# Patient Record
Sex: Female | Born: 1975
Health system: Southern US, Community
[De-identification: ages and names within clinical notes are randomized; demographics above are authoritative.]

## PROBLEM LIST (undated history)

## (undated) ENCOUNTER — Emergency Department (HOSPITAL_BASED_OUTPATIENT_CLINIC_OR_DEPARTMENT_OTHER): Admission: EM | Payer: Medicaid Other

## (undated) DIAGNOSIS — E079 Disorder of thyroid, unspecified: Secondary | ICD-10-CM

---

## 2009-12-08 ENCOUNTER — Ambulatory Visit (HOSPITAL_BASED_OUTPATIENT_CLINIC_OR_DEPARTMENT_OTHER): Admission: RE | Admit: 2009-12-08 | Discharge: 2009-12-08 | Payer: Self-pay | Admitting: Obstetrics and Gynecology

## 2009-12-08 ENCOUNTER — Ambulatory Visit: Payer: Self-pay | Admitting: Diagnostic Radiology

## 2019-06-24 ENCOUNTER — Encounter (HOSPITAL_BASED_OUTPATIENT_CLINIC_OR_DEPARTMENT_OTHER): Payer: Self-pay | Admitting: Emergency Medicine

## 2019-06-24 ENCOUNTER — Emergency Department (HOSPITAL_BASED_OUTPATIENT_CLINIC_OR_DEPARTMENT_OTHER)
Admission: EM | Admit: 2019-06-24 | Discharge: 2019-06-24 | Disposition: A | Discharge status: Home / Self Care | Payer: Medicaid Other | Attending: Emergency Medicine | Admitting: Emergency Medicine

## 2019-06-24 ENCOUNTER — Other Ambulatory Visit: Payer: Self-pay

## 2019-06-24 DIAGNOSIS — M545 Low back pain, unspecified: Secondary | ICD-10-CM

## 2019-06-24 MED ORDER — METHYLPREDNISOLONE 4 MG PO TBPK: ORAL_TABLET | ORAL | 0 refills | Status: AC

## 2019-06-24 MED ORDER — CYCLOBENZAPRINE HCL 10 MG PO TABS: 10.0000 mg | ORAL_TABLET | Freq: Two times a day (BID) | ORAL | 0 refills | Status: AC | PRN

## 2019-06-24 MED FILL — CYCLOBENZAPRINE HCL 10 MG T: 10 | 10 days supply | Qty: 20 | Fill #0

## 2019-06-24 MED FILL — METHYLPREDNISOLONE 4 MG TBP: 4 | 6 days supply | Qty: 21 | Fill #0

## 2019-06-24 NOTE — ED Provider Notes (Signed)
[facility] EMERGENCY DEPARTMENT Provider Note   CSN: [id number] Arrival date & time: 06/24/19  6834     History   Chief Complaint Chief Complaint  Patient presents with  . Back Pain    HPI Julie Hubbard is a 43 y.o. female.     The history is provided by the patient.  Back Pain Location:  Lumbar spine Quality:  Aching Radiates to:  Does not radiate Pain severity:  Mild Onset quality:  Gradual Duration:  2 days Timing:  Intermittent Progression:  Waxing and waning Chronicity:  Recurrent Context: physical stress   Relieved by:  NSAIDs Worsened by:  Movement Associated symptoms: no abdominal pain, no abdominal swelling, no bladder incontinence, no bowel incontinence, no chest pain, no dysuria, no fever, no headaches, no leg pain, no numbness, no paresthesias, no pelvic pain, no perianal numbness, no tingling, no weakness and no weight loss     History reviewed. No pertinent past medical history.  There are no active problems to display for this patient.   History reviewed. No pertinent surgical history.   OB History   No obstetric history on file.      Home Medications    Prior to Admission medications   Medication Sig Start Date End Date Taking? Authorizing Provider  omeprazole (PRILOSEC) 40 MG capsule Take 40 mg by mouth daily.   Yes [provider]    Family History No family history on file.  Social History Social History   Tobacco Use  . Smoking status: Never Smoker  . Smokeless tobacco: Never Used  Substance Use Topics  . Alcohol use: Not Currently  . Drug use: Never     Allergies   Patient has no known allergies.   Review of Systems Review of Systems  Constitutional: Negative for chills, fever and weight loss.  HENT: Negative for ear pain and sore throat.   Eyes: Negative for pain and visual disturbance.  Respiratory: Negative for cough and shortness of breath.   Cardiovascular: Negative for chest pain and  palpitations.  Gastrointestinal: Negative for abdominal pain, bowel incontinence and vomiting.  Genitourinary: Negative for bladder incontinence, dysuria, hematuria and pelvic pain.  Musculoskeletal: Positive for back pain. Negative for arthralgias.  Skin: Negative for color change and rash.  Neurological: Negative for tingling, seizures, syncope, weakness, numbness, headaches and paresthesias.  All other systems reviewed and are negative.    Physical Exam Updated Vital Signs BP 124/85 (BP Location: Right Arm)   Pulse 75   Temp 98.4 F (36.9 C) (Oral)   Resp 16   Ht 5\' 7"  (1.702 m)   Wt 83 kg   LMP 06/01/2019   SpO2 100%   BMI 28.66 kg/m   Physical Exam Vitals signs and nursing note reviewed.  Constitutional:      General: She is not in acute distress.    Appearance: She is well-developed.  HENT:     Head: Normocephalic and atraumatic.  Eyes:     Conjunctiva/sclera: Conjunctivae normal.     Pupils: Pupils are equal, round, and reactive to light.  Neck:     Musculoskeletal: Normal range of motion and neck supple. No muscular tenderness.  Cardiovascular:     Rate and Rhythm: Normal rate and regular rhythm.     Pulses: Normal pulses.     Heart sounds: Normal heart sounds. No murmur.  Pulmonary:     Effort: Pulmonary effort is normal. No respiratory distress.     Breath sounds: Normal breath sounds.  Abdominal:     Palpations: Abdomen is soft.     Tenderness: There is no abdominal tenderness.  Musculoskeletal: Normal range of motion.        General: Tenderness present.     Comments: No midline spinal tenderness, tenderness to right-sided paraspinal lumbar muscles  Skin:    General: Skin is warm and dry.  Neurological:     General: No focal deficit present.     Mental Status: She is alert and oriented to person, place, and time.     Cranial Nerves: No cranial nerve deficit.     Sensory: No sensory deficit.     Motor: No weakness.     Coordination: Coordination  normal.     Gait: Gait normal.     Comments: 5+ out of 5 strength throughout, normal sensation, no drift      ED Treatments / Results  Labs (all labs ordered are listed, but only abnormal results are displayed) Labs Reviewed - No data to display  EKG None  Radiology No results found.  Procedures Procedures (including critical care time)  Medications Ordered in ED Medications - No data to display   Initial Impression / Assessment and Plan / ED Course  I have reviewed the triage vital signs and the nursing notes.  Pertinent labs & imaging results that were available during my care of the patient were reviewed by me and considered in my medical decision making (see chart for details).        Julie Hubbard is a 43 year old female with history of back pain who presents to the ED with back spasm, back pain.  Patient with normal vitals.  No fever.  Patient states that 2 days ago she threw her back out again.  Mostly in the right lower spine.  Does not have any numbness or tingling.  No concern for cauda equina.  No fever.  No concern for epidural abscess.  History of the same.  Has used steroids in the past with some relief.  Has taken Tylenol Motrin with some relief.  Has a normal neurological exam.  No midline spinal tenderness.  Denies any trauma.  No concern for any cord compression.  Will prescribe prednisone and Flexeril.  Recommend close follow-up with primary care doctor as patient would likely benefit from physical therapy, possibly outpatient MRI, possibly pain referral.  Given return precautions and discharged in ED in good condition.  Patient denied any urinary symptoms.  No concern for pregnancy.  Last menstrual cycle about 2 to 3 weeks ago.  This chart was dictated using voice recognition software.  Despite best efforts to proofread,  errors can occur which can change the documentation meaning.    Final Clinical Impressions(s) / ED Diagnoses   Final diagnoses:  Acute  right-sided low back pain, unspecified whether sciatica present    ED Discharge Orders    None       Lennice Sites, DO 06/24/19 [phone]

## 2019-06-24 NOTE — ED Notes (Signed)
Pt verbalized understanding of dc instructions.

## 2019-06-24 NOTE — ED Triage Notes (Signed)
Low back pain right in the middle, non radiating x 2 days. Denies injury. Worse with movement.

## 2020-04-28 ENCOUNTER — Encounter: Payer: Medicaid Other | Admitting: Family Medicine

## 2020-06-02 ENCOUNTER — Ambulatory Visit (INDEPENDENT_AMBULATORY_CARE_PROVIDER_SITE_OTHER): Payer: Medicaid Other | Admitting: Obstetrics & Gynecology

## 2020-06-02 ENCOUNTER — Encounter: Payer: Self-pay | Admitting: Obstetrics & Gynecology

## 2020-06-02 ENCOUNTER — Other Ambulatory Visit: Payer: Self-pay

## 2020-06-02 VITALS — BP 115/75 | HR 70 | Ht 68.0 in | Wt 195.0 lb

## 2020-06-02 DIAGNOSIS — K5900 Constipation, unspecified: Secondary | ICD-10-CM

## 2020-06-02 DIAGNOSIS — R1011 Right upper quadrant pain: Secondary | ICD-10-CM | POA: Diagnosis not present

## 2020-06-02 DIAGNOSIS — N941 Unspecified dyspareunia: Secondary | ICD-10-CM

## 2020-06-02 MED ORDER — DICYCLOMINE HCL 10 MG PO CAPS
10.0000 mg | ORAL_CAPSULE | Freq: Every day | ORAL | 0 refills | Status: AC | PRN
Start: 2020-06-02 — End: ?

## 2020-06-02 NOTE — Patient Instructions (Signed)

## 2020-06-02 NOTE — Progress Notes (Signed)
History:  44 y.o. [id number] here today for dyspareunia. Pt reports that with intercourse she has pain in her abd. She denies pain with insertion or with deep thrusting. She reports pain in the upper quads bilaterally that is increased with intercourse. She is sexually active ~3-4 times per month. She says that as time continues she has started to note pain when she begins to think about beign sexually active. Pt reports that she has been married for 15 years and this pain has been present for the past 7-8 years. She has spoken to multiple docs with no relief of her sx. She reports a h/o constipation. She was prev placed on meds that worked but, it is stopped working at present. She does not exercise much. She does not eat many raw fruits or vegetable.         The following portions of the patient's history were reviewed and updated as appropriate: allergies, current medications, past family history, past medical history, past social history, past surgical history and problem list.  Review of Systems:  Pertinent items are noted in HPI.    Objective:  Physical Exam Blood pressure 115/75, pulse 70, height 5\' 8"  (1.727 m), weight 195 lb (88.5 kg), last menstrual period 05/17/2020.  CONSTITUTIONAL: Well-developed, well-nourished female in no acute distress.  HENT:  Normocephalic, atraumatic EYES: Conjunctivae and EOM are normal. No scleral icterus.  NECK: Normal range of motion SKIN: Skin is warm and dry. No rash noted. Not diaphoretic.No pallor. NEUROLGIC: Alert and oriented to person, place, and time. Normal coordination.  Abd: Soft, nontender and nondistended Pelvic: Normal appearing external genitalia; normal appearing vaginal mucosa and cervix.  Normal discharge.  Small uterus, no other palpable masses, no uterine or adnexal tenderness. The ovaries are palpable but, very low in the pelvis and outside of where the pt reports pain.     Assessment & Plan:  RUQ pain- Pt has never had her gallbladder  eval. The abd pain that pt describes with intercourse appear more GI in nature.   Dyspareunia exact etiology not understood. Likely GI. Will try Bentyl prn with intercourse to see if this provides some relief.   Constipation- reviewed increased fiber in her diet. Also recommended Miralax nightly.   Diagnoses and all orders for this visit:  Right upper quadrant pain -     Cancel: 07/17/2020 Abdomen Limited; Future -     US Abdomen Limited RUQ (LIVER/GB); Future -     dicyclomine (BENTYL) 10 MG capsule; Take 1 capsule (10 mg total) by mouth daily as needed for spasms. Prior to intercourse.  Dyspareunia, female -     dicyclomine (BENTYL) 10 MG capsule; Take 1 capsule (10 mg total) by mouth daily as needed for spasms. Prior to intercourse.  Constipation, unspecified constipation type    Total face-to-face time with patient was 30 min.  Greater than 50% was spent in counseling and coordination of care with the patient.   Khadija Thier L. Harraway-Smith, M.D., [place]

## 2020-06-10 ENCOUNTER — Ambulatory Visit (HOSPITAL_BASED_OUTPATIENT_CLINIC_OR_DEPARTMENT_OTHER)
Admission: RE | Admit: 2020-06-10 | Discharge: 2020-06-10 | Disposition: A | Discharge status: Home / Self Care | Payer: Medicaid Other | Source: Ambulatory Visit | Attending: Obstetrics & Gynecology | Admitting: Obstetrics & Gynecology

## 2020-06-10 ENCOUNTER — Other Ambulatory Visit: Payer: Self-pay

## 2020-06-10 DIAGNOSIS — R1011 Right upper quadrant pain: Secondary | ICD-10-CM

## 2020-12-20 ENCOUNTER — Emergency Department (HOSPITAL_BASED_OUTPATIENT_CLINIC_OR_DEPARTMENT_OTHER): Payer: Medicaid Other

## 2020-12-20 ENCOUNTER — Emergency Department (HOSPITAL_BASED_OUTPATIENT_CLINIC_OR_DEPARTMENT_OTHER)
Admission: EM | Admit: 2020-12-20 | Discharge: 2020-12-20 | Disposition: A | Discharge status: Home / Self Care | Payer: Medicaid Other | Attending: Emergency Medicine | Admitting: Emergency Medicine

## 2020-12-20 ENCOUNTER — Encounter (HOSPITAL_BASED_OUTPATIENT_CLINIC_OR_DEPARTMENT_OTHER): Payer: Self-pay

## 2020-12-20 ENCOUNTER — Other Ambulatory Visit: Payer: Self-pay

## 2020-12-20 DIAGNOSIS — U071 COVID-19: Secondary | ICD-10-CM | POA: Diagnosis not present

## 2020-12-20 DIAGNOSIS — M791 Myalgia, unspecified site: Secondary | ICD-10-CM | POA: Diagnosis present

## 2020-12-20 DIAGNOSIS — R059 Cough, unspecified: Secondary | ICD-10-CM

## 2020-12-20 DIAGNOSIS — R109 Unspecified abdominal pain: Secondary | ICD-10-CM | POA: Insufficient documentation

## 2020-12-20 DIAGNOSIS — R52 Pain, unspecified: Secondary | ICD-10-CM

## 2020-12-20 LAB — CBC WITH DIFFERENTIAL/PLATELET
Abs Immature Granulocytes: 0.01 10*3/uL (ref 0.00–0.07)
Basophils Absolute: 0 10*3/uL (ref 0.0–0.1)
Basophils Relative: 1 %
Eosinophils Absolute: 0.1 10*3/uL (ref 0.0–0.5)
Eosinophils Relative: 2 %
HCT: 40.8 % (ref 36.0–46.0)
Hemoglobin: 13.4 g/dL (ref 12.0–15.0)
Immature Granulocytes: 0 %
Lymphocytes Relative: 16 %
Lymphs Abs: 0.8 10*3/uL (ref 0.7–4.0)
MCH: 27 pg (ref 26.0–34.0)
MCHC: 32.8 g/dL (ref 30.0–36.0)
MCV: 82.3 fL (ref 80.0–100.0)
Monocytes Absolute: 0.8 10*3/uL (ref 0.1–1.0)
Monocytes Relative: 16 %
Neutro Abs: 3.3 10*3/uL (ref 1.7–7.7)
Neutrophils Relative %: 65 %
Platelets: 190 10*3/uL (ref 150–400)
RBC: 4.96 MIL/uL (ref 3.87–5.11)
RDW: 13.7 % (ref 11.5–15.5)
WBC: 5 10*3/uL (ref 4.0–10.5)
nRBC: 0 % (ref 0.0–0.2)

## 2020-12-20 LAB — COMPREHENSIVE METABOLIC PANEL
ALT: 22 U/L (ref 0–44)
AST: 22 U/L (ref 15–41)
Albumin: 3.6 g/dL (ref 3.5–5.0)
Alkaline Phosphatase: 75 U/L (ref 38–126)
Anion gap: 7 (ref 5–15)
BUN: 11 mg/dL (ref 6–20)
CO2: 24 mmol/L (ref 22–32)
Calcium: 8.4 mg/dL — ABNORMAL LOW (ref 8.9–10.3)
Chloride: 104 mmol/L (ref 98–111)
Creatinine, Ser: 0.78 mg/dL (ref 0.44–1.00)
GFR, Estimated: >60 mL/min (ref >60)
Glucose, Bld: 99 mg/dL (ref 70–99)
Potassium: 3.5 mmol/L (ref 3.5–5.1)
Sodium: 135 mmol/L (ref 135–145)
Total Bilirubin: 0.2 mg/dL — ABNORMAL LOW (ref 0.3–1.2)
Total Protein: 7.4 g/dL (ref 6.5–8.1)

## 2020-12-20 LAB — URINALYSIS, ROUTINE W REFLEX MICROSCOPIC
Bilirubin Urine: NEGATIVE
Glucose, UA: NEGATIVE mg/dL
Ketones, ur: NEGATIVE mg/dL
Nitrite: NEGATIVE
Protein, ur: NEGATIVE mg/dL
Specific Gravity, Urine: 1.01 (ref 1.005–1.030)
pH: 6 (ref 5.0–8.0)

## 2020-12-20 LAB — RESP PANEL BY RT-PCR (FLU A&B, COVID) ARPGX2
Influenza A by PCR: NEGATIVE
Influenza B by PCR: NEGATIVE
SARS Coronavirus 2 by RT PCR: POSITIVE — AB

## 2020-12-20 LAB — TROPONIN I (HIGH SENSITIVITY): Troponin I (High Sensitivity): <2 ng/L (ref <18)

## 2020-12-20 LAB — URINALYSIS, MICROSCOPIC (REFLEX)

## 2020-12-20 LAB — PREGNANCY, URINE: Preg Test, Ur: NEGATIVE

## 2020-12-20 LAB — LIPASE, BLOOD: Lipase: 35 U/L (ref 11–51)

## 2020-12-20 LAB — MAGNESIUM: Magnesium: 1.8 mg/dL (ref 1.7–2.4)

## 2020-12-20 LAB — LACTIC ACID, PLASMA: Lactic Acid, Venous: 0.9 mmol/L (ref 0.5–1.9)

## 2020-12-20 MED ORDER — KETOROLAC TROMETHAMINE 15 MG/ML IJ SOLN
15.0000 mg | Freq: Once | INTRAMUSCULAR | Status: AC
  Administered 2020-12-20: 15 mg via INTRAVENOUS
  Filled 2020-12-20: qty 1

## 2020-12-20 MED ORDER — SODIUM CHLORIDE 0.9 % IV BOLUS
1000.0000 mL | Freq: Once | INTRAVENOUS | Status: AC
  Administered 2020-12-20: 1000 mL via INTRAVENOUS

## 2020-12-20 NOTE — ED Notes (Signed)
Patient transported to X-ray 

## 2020-12-20 NOTE — ED Notes (Signed)
PT placed on blood pressure cuff and full set of vitals obtained

## 2020-12-20 NOTE — ED Provider Notes (Signed)
[facility] EMERGENCY DEPARTMENT Provider Note   CSN: [id number] Arrival date & time: 12/20/20  5462     History Chief Complaint  Patient presents with  . Back Pain    Julie Hubbard is a 45 y.o. female.  HPI  45 year old female with no pertinent past medical history presents to the emergency department today for evaluation of body aches, back pain, abdominal pain, nausea.  Interpreter was used at bedside.  Patient reports that she was showering last night and felt chills with subjective fevers.  Patient reports lower abdominal pain and diffuse back pain.  Patient reports not feeling well.  This morning she took Tylenol with this does not help with her pain.  Patient reports that her both shoulders feel stiff.  No chest pain or shortness of breath.  No diarrhea.  No urinary symptoms.  She has been coughing with some phlegm and she is concerned she may have pneumonia.  She reports being very fatigued, tired over the past several days.  No recent surgeries or hospitalizations.  Patient denies a history of oral contraceptive use.  No swelling in lower extremities.  No recent long car rides or plane trips.  Patient denies history of PE/DVT.  No cardiac history.  No known sick contacts.      History reviewed. No pertinent past medical history.  There are no problems to display for this patient.   History reviewed. No pertinent surgical history.   OB History    Gravida  3   Para  3   Term  3   Preterm      AB      Living  3     SAB      IAB      Ectopic      Multiple      Live Births  3           History reviewed. No pertinent family history.  Social History   Tobacco Use  . Smoking status: Never Smoker  . Smokeless tobacco: Never Used  Substance Use Topics  . Alcohol use: Not Currently  . Drug use: Never    Home Medications Prior to Admission medications   Medication Sig Start Date End Date Taking? Authorizing Provider  Calcium  Carb-Cholecalciferol (CALCIUM 1000 + D PO) Take by mouth.    [provider]  cyclobenzaprine (FLEXERIL) 10 MG tablet Take 1 tablet (10 mg total) by mouth 2 (two) times daily as needed for up to 20 doses for muscle spasms. Patient not taking: Reported on 06/02/2020 06/24/19   Virgina Norfolk, DO  dicyclomine (BENTYL) 10 MG capsule Take 1 capsule (10 mg total) by mouth daily as needed for spasms. Prior to intercourse. 06/02/20   Willodean Rosenthal, MD  lubiprostone (AMITIZA) 8 MCG capsule Take 8 mcg by mouth 2 (two) times daily with a meal.    [provider]  methylPREDNISolone (MEDROL DOSEPAK) 4 MG TBPK tablet Follow package insert Patient not taking: Reported on 06/02/2020 06/24/19   Virgina Norfolk, DO  omeprazole (PRILOSEC) 40 MG capsule Take 40 mg by mouth daily. Patient not taking: Reported on 06/02/2020    [provider]  simvastatin (ZOCOR) 10 MG tablet Take 10 mg by mouth daily.    [provider]    Allergies    Patient has no known allergies.  Review of Systems   Review of Systems  Constitutional: Positive for chills and fever.  HENT: Negative for congestion.   Eyes: Negative for  discharge.  Respiratory: Positive for cough. Negative for shortness of breath.   Cardiovascular: Negative for chest pain.  Gastrointestinal: Positive for abdominal pain and nausea. Negative for diarrhea and vomiting.  Genitourinary: Negative for difficulty urinating, dysuria, flank pain, frequency, hematuria and urgency.  Musculoskeletal: Positive for arthralgias, back pain and myalgias.  Skin: Negative for color change.  Neurological: Negative for headaches.  Psychiatric/Behavioral: Negative for confusion.    Physical Exam Updated Vital Signs BP 127/88 (BP Location: Right Arm)   Pulse 100   Temp 98.7 F (37.1 C) (Oral)   Resp 18   Ht 5\' 6"  (1.676 m)   Wt 83.5 kg   LMP 11/23/2020 (Exact Date)   SpO2 98%   BMI 29.70 kg/m   Physical Exam Vitals and  nursing note reviewed.  Constitutional:      General: She is not in acute distress.    Appearance: She is well-developed. She is not ill-appearing or toxic-appearing.  HENT:     Head: Normocephalic and atraumatic.     Nose: Nose normal.  Eyes:     General:        Right eye: No discharge.        Left eye: No discharge.     Conjunctiva/sclera: Conjunctivae normal.     Pupils: Pupils are equal, round, and reactive to light.  Cardiovascular:     Rate and Rhythm: Normal rate and regular rhythm.     Pulses: Normal pulses.     Heart sounds: Normal heart sounds. No murmur heard. No friction rub. No gallop.   Pulmonary:     Effort: Pulmonary effort is normal. No respiratory distress.     Breath sounds: Normal breath sounds. No stridor. No wheezing, rhonchi or rales.  Chest:     Chest wall: No tenderness.  Abdominal:     General: Abdomen is flat. Bowel sounds are normal.     Palpations: Abdomen is soft.     Tenderness: There is abdominal tenderness (lower). There is no right CVA tenderness, left CVA tenderness, guarding or rebound.  Musculoskeletal:        General: No tenderness. Normal range of motion.     Cervical back: Normal range of motion and neck supple.  Lymphadenopathy:     Cervical: No cervical adenopathy.  Skin:    General: Skin is warm and dry.     Capillary Refill: Capillary refill takes less than 2 seconds.  Neurological:     Mental Status: She is alert and oriented to person, place, and time.  Psychiatric:        Behavior: Behavior normal.        Thought Content: Thought content normal.        Judgment: Judgment normal.     ED Results / Procedures / Treatments   Labs (all labs ordered are listed, but only abnormal results are displayed) Labs Reviewed - No data to display  EKG EKG Interpretation  Date/Time:  Monday Dec 20 2020 09:54:29 EDT Ventricular Rate:  97 PR Interval:  132 QRS Duration: 97 QT Interval:  348 QTC Calculation: 442 R Axis:   68 Text  Interpretation: Sinus rhythm Consider right atrial enlargement RSR' in V1 or V2, probably normal variant No old tracing to compare Confirmed by 11-30[birth year]1985 [phone]) on 12/20/2020 10:12:33 AM   Radiology DG Chest 2 View  Result Date: 12/20/2020 CLINICAL DATA:  Chest pain. EXAM: CHEST - 2 VIEW COMPARISON:  05/31/2016. FINDINGS: The cardiac silhouette, mediastinal and hilar contours are within normal  limits. No definite infiltrates or effusions. Left apical density most likely artifact from the patient's hair but I would recommend a repeat frontal chest film with the patients hair up out of the way to make sure there is not underlying apical process. The bony thorax is intact. IMPRESSION: Left apical density most likely artifact from the patient's hair but I would recommend a repeat frontal chest film with the hair out of the way to make sure there is not underlying apical process. Otherwise normal chest film. Electronically Signed   By: Rudie MeyerP.  Gallerani M.D.   On: 12/20/2020 11:52   CT CHEST ABDOMEN PELVIS WO CONTRAST  Result Date: 12/20/2020 CLINICAL DATA:  Chest pain and lower abdominal pain 3 days. COVID-19 positive. EXAM: CT CHEST, ABDOMEN AND PELVIS WITHOUT CONTRAST TECHNIQUE: Multidetector CT imaging of the chest, abdomen and pelvis was performed following the standard protocol without IV contrast. COMPARISON:  CT 08/05/2020 FINDINGS: CT CHEST FINDINGS Cardiovascular: Heart size is normal. No pericardial effusion. Thoracic aorta is normal in course and caliber. Pulmonary vasculature is nondilated. Mediastinum/Nodes: No axillary, mediastinal, or hilar lymphadenopathy evident on non contrasted exam. Thyroid, trachea, and esophagus appear within normal limits. Lungs/Pleura: Lungs are clear. No airspace consolidation. No pleural effusion or pneumothorax. Musculoskeletal: No chest wall mass or suspicious bone lesions identified. CT ABDOMEN PELVIS FINDINGS Hepatobiliary: Unremarkable unenhanced appearance of  the liver. No focal liver lesion identified. Gallbladder within normal limits. No hyperdense gallstone. No biliary dilatation. Pancreas: Unremarkable. No pancreatic ductal dilatation or surrounding inflammatory changes. Spleen: Normal in size without focal abnormality. Adrenals/Urinary Tract: Adrenal glands are unremarkable. Kidneys are normal, without renal calculi, focal lesion, or hydronephrosis. Bladder is unremarkable. Stomach/Bowel: Stomach is within normal limits. Appendix appears normal. No evidence of bowel wall thickening, distention, or inflammatory changes. Vascular/Lymphatic: No significant vascular findings are present. No enlarged abdominal or pelvic lymph nodes. Reproductive: Retroverted uterus.  No adnexal masses. Other: No free fluid. No abdominopelvic fluid collection. No pneumoperitoneum. No abdominal wall hernia. Musculoskeletal: Sclerotic changes predominantly involving the iliac side of both sacroiliac joints with possible subtle erosion along the inferior margin of the right SI joint (series 5, image 55). Osseous structures are otherwise within normal limits. IMPRESSION: 1. No acute findings in the chest, abdomen, or pelvis. 2. Sclerotic changes along the bilateral SI joints suggests sequela of sacroiliitis. Electronically Signed   By: Duanne GuessNicholas  Plundo D.O.   On: 12/20/2020 13:11    Procedures Procedures   Medications Ordered in ED Medications - No data to display  ED Course  I have reviewed the triage vital signs and the nursing notes.  Pertinent labs & imaging results that were available during my care of the patient were reviewed by me and considered in my medical decision making (see chart for details).    MDM Rules/Calculators/A&P                          45 year old presents the ER for body aches, fever, chills, abdominal pain, cough.  Patient is overall well-appearing.  She does report severe generalized body aches that are not controlled with Tylenol.  Patient's  vital signs are reassuring.  She is afebrile.  No significant tachycardia noted.  Patient is not tachypneic or hypoxic.  Does have some lower pain of a patient of the abdomen.  Lungs clear to auscultation.  Chest x-ray showed no signs of focal trait concerning for pneumonia.  Labs reviewed.  EKG without any ischemic changes.  Troponins  were negative.  No significant electrolyte derangement.  Patient is positive for COVID-19.  CT performed that shows no acute findings.  Sequelae of sacroiliitis changes noted over the SI joints bilaterally.  Patient will need to continue quarantining at home.  There is no indication for admission at this time as patient is not hypoxic.  Encouraged symptomatic treatment.  No negation for antibiotics as patient has no signs of bacterial pneumonia.  Presentation not consistent with PE, ACS or dissection at this time.  Pt is hemodynamically stable, in NAD, & able to ambulate in the ED. Evaluation does not show pathology that would require ongoing emergent intervention or inpatient treatment. I explained the diagnosis to the patient. Pain has been managed & has no complaints prior to dc. Pt is comfortable with above plan and is stable for discharge at this time. All questions were answered prior to disposition. Strict return precautions for f/u to the ED were discussed. Encouraged follow up with PCP.  Final Clinical Impression(s) / ED Diagnoses Final diagnoses:  Abdominal pain  COVID-19  Cough  Generalized body aches    Rx / DC Orders ED Discharge Orders    None       Wallace Keller 12/20/20 1321    Terrilee Files, MD 12/20/20 [phone]

## 2020-12-20 NOTE — ED Triage Notes (Signed)
Pt reports pain started Friday night after taking a shower.  States pain involves or chest and lower abdomen. Moaning throughout triage assessment

## 2020-12-20 NOTE — Discharge Instructions (Signed)
You are positive for COVID-19 which is the cause of your symptoms.  Unfortunately there is no cure for this.  This will have to resolve on its own.  It is a virus.  There is no indication for antibiotics.  You need to continue taking Motrin and Tylenol at home for your body aches and fever.  Drink plenty of fluids to stay hydrated.  Make sure that you are monitoring your oxygenation at home.  If your pulse ox drops below 90% this would require you to come back to the ER.  Please make sure you follow-up with your primary care doctor.  You need to continue quarantine for the next 7 days.  If you are febrile on day 7 will need to quarantine for an additional 2 days.  Any close contacts will also need to be informed of your positive COVID-19 status.

## 2020-12-21 ENCOUNTER — Telehealth: Payer: Self-pay | Admitting: Adult Health

## 2020-12-21 NOTE — Telephone Encounter (Signed)
Called to discuss with patient about COVID-19 symptoms and the use of one of the available treatments for those with mild to moderate Covid symptoms and at a high risk of hospitalization.  Pt appears to qualify for outpatient treatment due to co-morbid conditions and/or a member of an at-risk group in accordance with the FDA Emergency Use Authorization.    Patient is feeling almost 100% better. Declines at this time.  [name]

## 2021-06-05 ENCOUNTER — Emergency Department (HOSPITAL_BASED_OUTPATIENT_CLINIC_OR_DEPARTMENT_OTHER)
Admission: EM | Admit: 2021-06-05 | Discharge: 2021-06-05 | Disposition: A | Discharge status: Home / Self Care | Payer: Medicaid Other

## 2021-09-14 ENCOUNTER — Other Ambulatory Visit (HOSPITAL_BASED_OUTPATIENT_CLINIC_OR_DEPARTMENT_OTHER): Payer: Self-pay | Admitting: Family Medicine

## 2021-09-14 DIAGNOSIS — R102 Pelvic and perineal pain: Secondary | ICD-10-CM

## 2021-09-19 ENCOUNTER — Ambulatory Visit (HOSPITAL_BASED_OUTPATIENT_CLINIC_OR_DEPARTMENT_OTHER)
Admission: RE | Admit: 2021-09-19 | Discharge: 2021-09-19 | Disposition: A | Discharge status: Home / Self Care | Payer: Medicaid Other | Source: Ambulatory Visit | Attending: Family Medicine | Admitting: Family Medicine

## 2021-09-19 ENCOUNTER — Other Ambulatory Visit: Payer: Self-pay

## 2021-09-19 DIAGNOSIS — R102 Pelvic and perineal pain: Secondary | ICD-10-CM | POA: Diagnosis present

## 2023-06-22 IMAGING — US US PELVIS COMPLETE
1 series · 14 of 25 positions shown · non-contrast
Comparison: None available

CLINICAL DATA: Pelvic pain on LEFT, LMP 08/24/2020

EXAM:
TRANSABDOMINAL ULTRASOUND OF PELVIS
TECHNIQUE: Transabdominal ultrasound examination of the pelvis was performed
including evaluation of the uterus, ovaries, adnexal regions, and
pelvic cul-de-sac.

[Series 1: us pelvis complete · 14 of 79 slices shown]
[im 1/79]
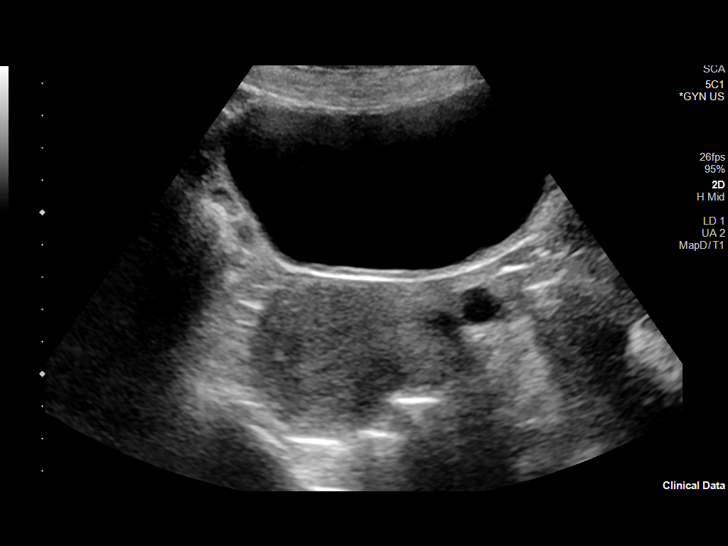
[im 7/79]
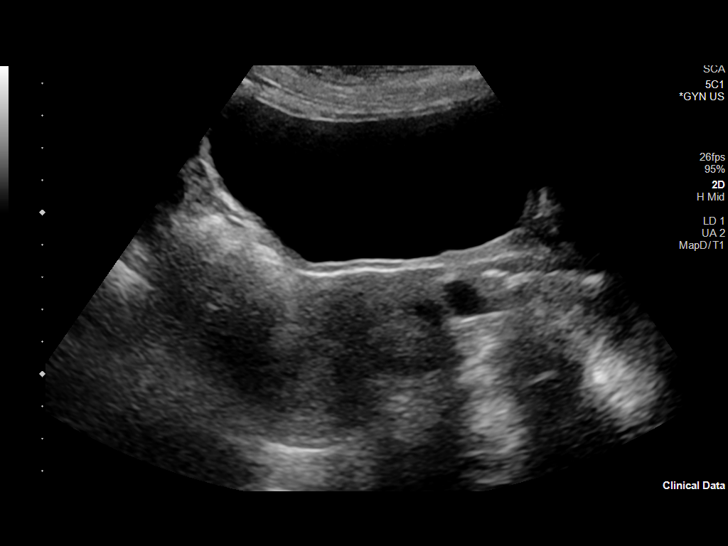
[im 14/79]
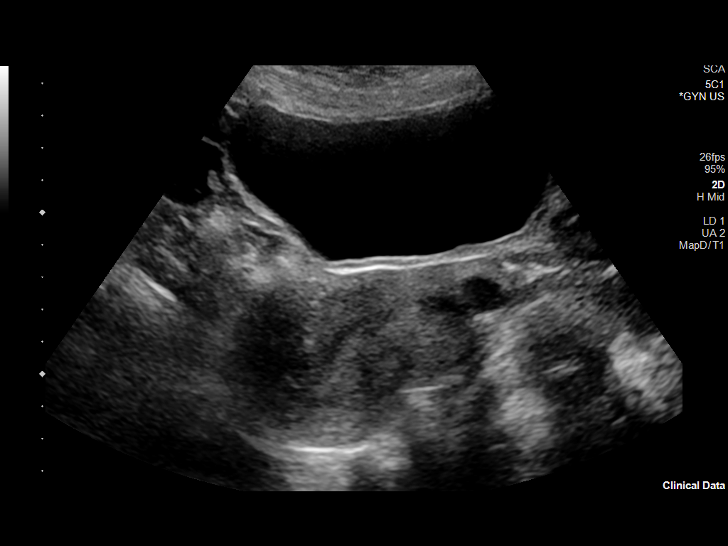
[im 20/79]
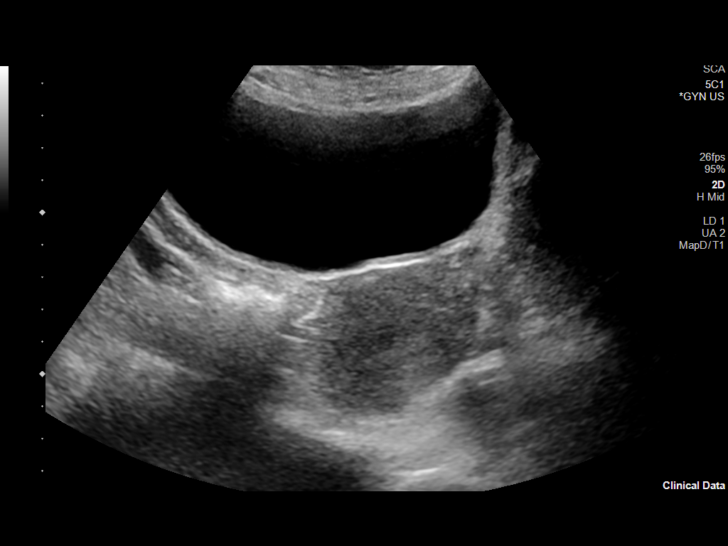
[im 27/79]
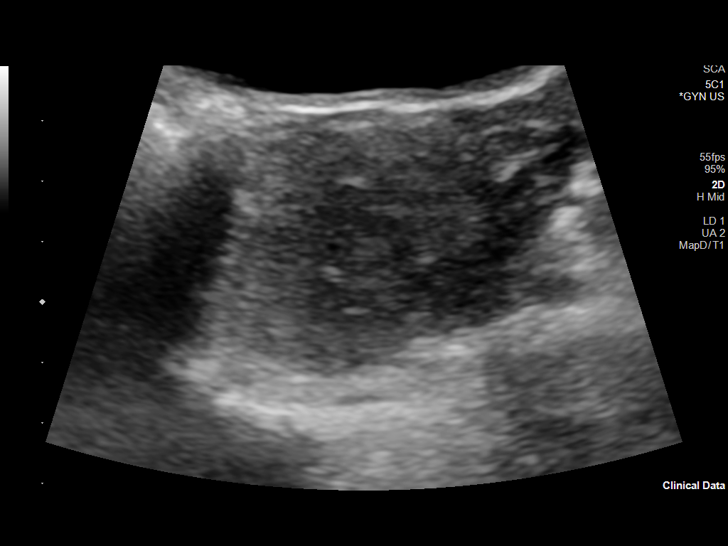
[im 30/79]
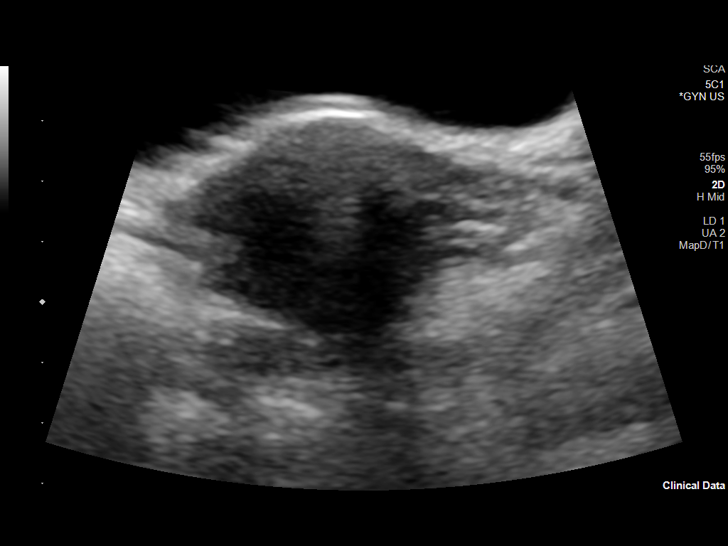
[im 36/79]
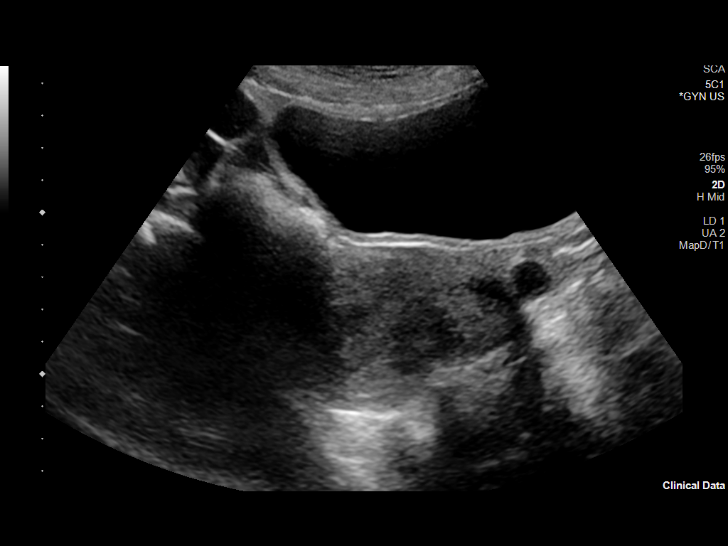
[im 43/79]
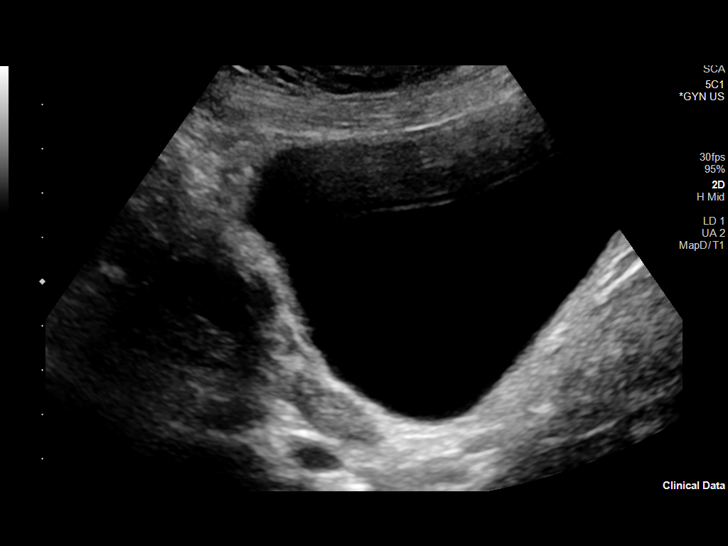
[im 49/79]
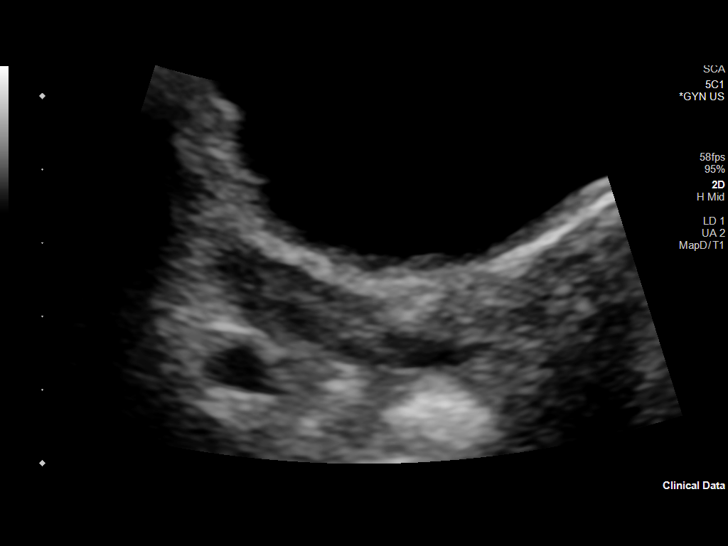
[im 53/79]
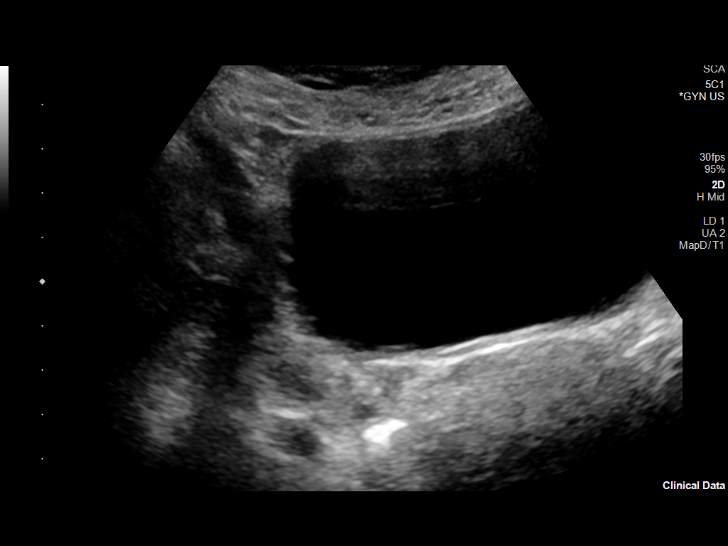
[im 59/79]
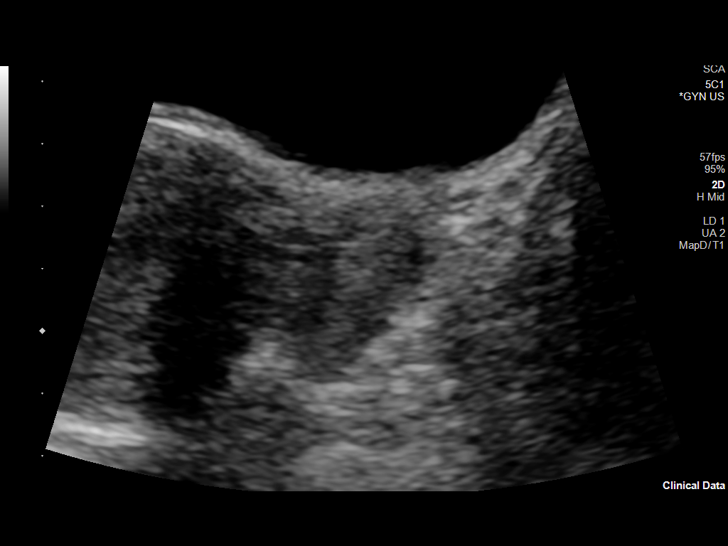
[im 66/79]
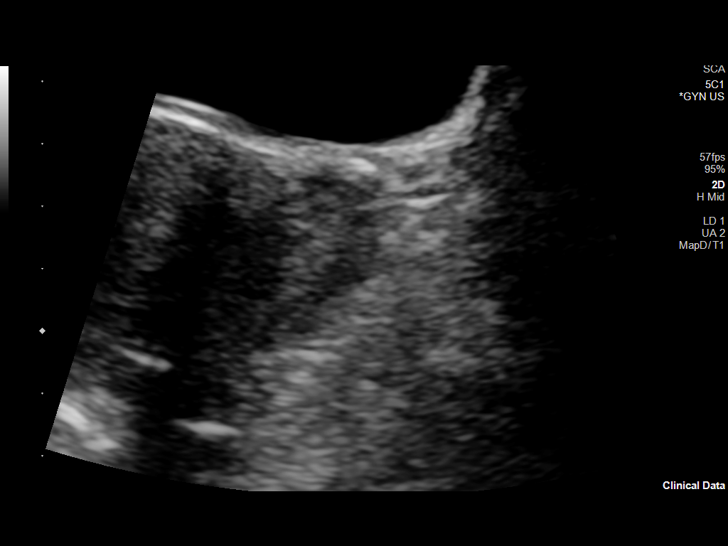
[im 72/79]
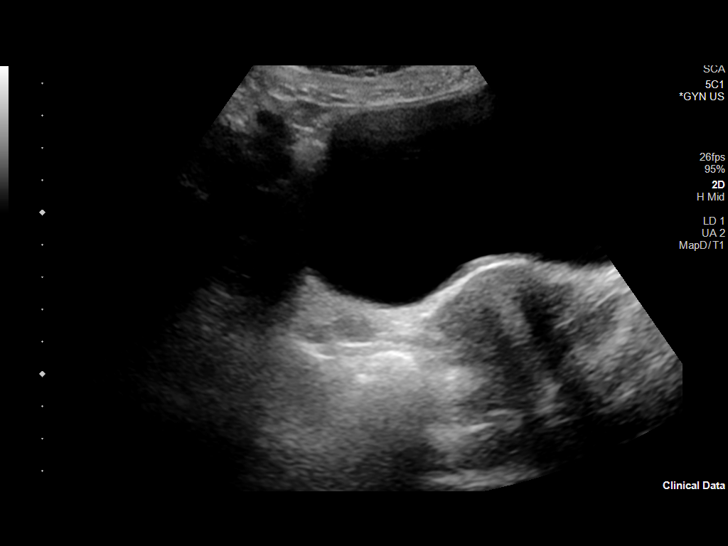
[im 79/79]
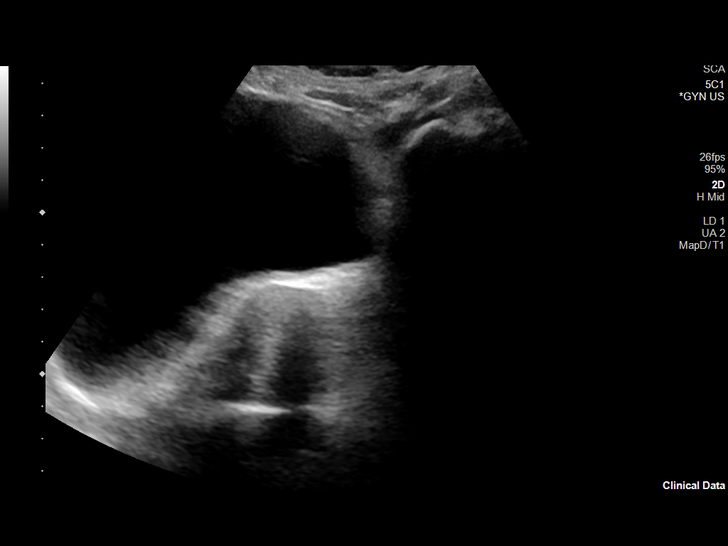

[14 of 25 positions shown; findings below may reference images not displayed]

FINDINGS: Uterus

Measurements: 7.8 x 4.8 x 5.9 cm = volume: 116 mL. Heterogeneous
myometrium. Nabothian cyst at cervix. No additional uterine masses.

Endometrium

Thickness: 10 mm.  No endometrial fluid or mass

Right ovary

Measurements: 2.5 x 1.1 x 2.2 cm = volume: 3.0 mL. Normal morphology
without mass

Left ovary

Measurements: 3.2 x 2.4 x 2.3 cm = volume: 9.0 mL. Normal morphology
without mass

Other findings:  No free pelvic fluid.  No adnexal masses.
IMPRESSION: Normal exam.

## 2023-12-06 ENCOUNTER — Encounter (HOSPITAL_BASED_OUTPATIENT_CLINIC_OR_DEPARTMENT_OTHER): Payer: Self-pay | Admitting: *Deleted

## 2023-12-06 ENCOUNTER — Other Ambulatory Visit: Payer: Self-pay

## 2023-12-06 ENCOUNTER — Emergency Department (HOSPITAL_BASED_OUTPATIENT_CLINIC_OR_DEPARTMENT_OTHER)
Admission: EM | Admit: 2023-12-06 | Discharge: 2023-12-06 | Disposition: A | Discharge status: Home / Self Care | Attending: Emergency Medicine | Admitting: Emergency Medicine

## 2023-12-06 ENCOUNTER — Emergency Department (HOSPITAL_BASED_OUTPATIENT_CLINIC_OR_DEPARTMENT_OTHER)

## 2023-12-06 DIAGNOSIS — S92424A Nondisplaced fracture of distal phalanx of right great toe, initial encounter for closed fracture: Secondary | ICD-10-CM

## 2023-12-06 DIAGNOSIS — W208XXA Other cause of strike by thrown, projected or falling object, initial encounter: Secondary | ICD-10-CM | POA: Insufficient documentation

## 2023-12-06 DIAGNOSIS — S39012A Strain of muscle, fascia and tendon of lower back, initial encounter: Secondary | ICD-10-CM

## 2023-12-06 DIAGNOSIS — S90211A Contusion of right great toe with damage to nail, initial encounter: Secondary | ICD-10-CM | POA: Insufficient documentation

## 2023-12-06 DIAGNOSIS — M545 Low back pain, unspecified: Secondary | ICD-10-CM | POA: Diagnosis not present

## 2023-12-06 DIAGNOSIS — S99921A Unspecified injury of right foot, initial encounter: Secondary | ICD-10-CM | POA: Diagnosis present

## 2023-12-06 HISTORY — DX: Disorder of thyroid, unspecified: E07.9

## 2023-12-06 NOTE — ED Provider Notes (Signed)
 [place] [facility] Provider Note   CSN: [id number] Arrival date & time: 12/06/23  1055     History No pertinent PMH Chief Complaint  Patient presents with   Toe Injury   Back Pain    Julie Hubbard is a 48 y.o. female.  Pt notes sudden onset low back pain (R>L) started 3 weeks ago after standing up from bending over to pick up a carton eggs. It was initially a sharp pain and severe. It has improved some but still hurts, worse with walking and movement such as bending and standing up. The pain radiates into posterior thighs. She denies any urinary or bowel issues. She has taken ibuprofen and tylenol which help some.  States she has had episodes of low back pain in the past but this is more severe and lasting longer.  This is difficult for her as she has 3 autistic children at home and has to move around a lot to care for them.  She also notes 4 days ago she was trying to move a recliner, it slipped and fell on her L big toe.  It was initially very painful but has improved.  She is able to walk on it without significant pain.    Back Pain      Home Medications Prior to Admission medications   Medication Sig Start Date End Date Taking? Authorizing Provider  Calcium Carb-Cholecalciferol (CALCIUM 1000 + D PO) Take by mouth.    [provider]  cyclobenzaprine  (FLEXERIL ) 10 MG tablet Take 1 tablet (10 mg total) by mouth 2 (two) times daily as needed for up to 20 doses for muscle spasms. Patient not taking: Reported on 06/02/2020 06/24/19   Lowery Rue, DO  dicyclomine  (BENTYL ) 10 MG capsule Take 1 capsule (10 mg total) by mouth daily as needed for spasms. Prior to intercourse. 06/02/20   Lenord Radon, MD  lubiprostone (AMITIZA) 8 MCG capsule Take 8 mcg by mouth 2 (two) times daily with a meal.    [provider]  methylPREDNISolone  (MEDROL  DOSEPAK) 4 MG TBPK tablet Follow package insert Patient not taking: Reported on  06/02/2020 06/24/19   Lowery Rue, DO  omeprazole (PRILOSEC) 40 MG capsule Take 40 mg by mouth daily. Patient not taking: Reported on 06/02/2020    [provider]  simvastatin (ZOCOR) 10 MG tablet Take 10 mg by mouth daily.    [provider]      Allergies    Patient has no known allergies.    Review of Systems   Review of Systems  Constitutional:  Negative for activity change.  Gastrointestinal: Negative.   Genitourinary:  Negative for difficulty urinating.  Musculoskeletal:  Positive for back pain.  Skin:  Positive for wound.    Physical Exam Updated Vital Signs BP (!) 124/94   Pulse 88   Resp 16   SpO2 98%  Physical Exam Constitutional:      General: She is not in acute distress.    Appearance: She is not ill-appearing.  Eyes:     Conjunctiva/sclera: Conjunctivae normal.  Cardiovascular:     Rate and Rhythm: Normal rate and regular rhythm.  Pulmonary:     Effort: Pulmonary effort is normal. No respiratory distress.     Breath sounds: Normal breath sounds.  Abdominal:     General: Abdomen is flat. There is no distension.     Palpations: Abdomen is soft.  Musculoskeletal:     Lumbar back: Tenderness present. No swelling  or deformity. Normal range of motion.     Comments: Good ROM with rotation, sidebending, flexion and extension.  Mild pain noted with flexion. Diffuse paraspinal (R>L) and lumbar spine tenderness to palpation, no significant point tenderness of spinous processes. Straight leg test negative bilaterally  Skin:    Comments: Nonbleeding wound on dorsal side of right great toe with small subungual hematoma and surrounding dried blood, see photo  Neurological:     Mental Status: She is alert.     ED Results / Procedures / Treatments   Labs (all labs ordered are listed, but only abnormal results are displayed) Labs Reviewed - No data to display  EKG None  Radiology No results found.  Procedures Procedures   None  Medications Ordered in ED Medications - No data to display  ED Course/ Medical Decision Making/ A&P   {                                Medical Decision Making 48 year old female presents with 3 weeks of low back pain which was sudden in onset with standing and has improved but still present, better with ibuprofen and Tylenol.  No red flag symptoms concerning for cauda equina.  Possibly lumbar paraspinal muscle strain and/or degenerative changes, disc herniation.  She also presents with right great toe injury after dropping a recliner on it 4 days ago.  Does have small subungual hematoma but not significant or painful enough to require trephination.  X-ray of right great toe shows nondisplaced tuft fracture at distal phalanx of right great toe  Patient remained stable and did not require admission.  She was discharged with instructions to wear hard soled shoes and given contact information to podiatry in case she desires to go.  She is advised to continue over-the-counter ibuprofen and Tylenol for pain and to follow-up with her PCP.  Amount and/or Complexity of Data Reviewed Radiology: ordered.   Final Clinical Impression(s) / ED Diagnoses Final diagnoses:  None    Rx / DC Orders ED Discharge Orders     None      Glenn Lange, DO Family medicine, PGY-3   Glenn Lange, DO 12/06/23 1423    Long, Shereen Dike, MD 12/07/23 [phone]

## 2023-12-06 NOTE — Discharge Instructions (Signed)
 You were seen in the emerged from today with toe injury.  You have a tiny fracture at the very tip of your toe.  This should heal up well on its own.  I have listed the name for a podiatrist should you need to see them.

## 2023-12-06 NOTE — ED Triage Notes (Signed)
 Pt is here for lower back pain x2-3 weeks.  Pt also has right great toe pain since dropping a couch on her great toe 2 days ago.

## 2023-12-06 NOTE — ED Notes (Signed)
 Pt alert and oriented X 4 at the time of discharge. RR even and unlabored. No acute distress noted. Pt verbalized understanding of discharge instructions as discussed. Pt ambulatory to lobby at time of discharge.
